# Patient Record
Sex: Female | Born: 1945 | Race: White | Hispanic: No | Marital: Married | Smoking: Former smoker
Health system: Southern US, Community
[De-identification: ages and names within clinical notes are randomized; demographics above are authoritative.]

## PROBLEM LIST (undated history)

## (undated) DIAGNOSIS — K219 Gastro-esophageal reflux disease without esophagitis: Secondary | ICD-10-CM

## (undated) DIAGNOSIS — T7840XA Allergy, unspecified, initial encounter: Secondary | ICD-10-CM

## (undated) DIAGNOSIS — E0789 Other specified disorders of thyroid: Secondary | ICD-10-CM

## (undated) DIAGNOSIS — M1990 Unspecified osteoarthritis, unspecified site: Secondary | ICD-10-CM

## (undated) DIAGNOSIS — E079 Disorder of thyroid, unspecified: Secondary | ICD-10-CM

## (undated) HISTORY — PX: WISDOM TOOTH EXTRACTION: SHX21

## (undated) HISTORY — DX: Other specified disorders of thyroid: E07.89

## (undated) HISTORY — DX: Allergy, unspecified, initial encounter: T78.40XA

## (undated) HISTORY — PX: BIOPSY THYROID: PRO38

## (undated) HISTORY — DX: Gastro-esophageal reflux disease without esophagitis: K21.9

## (undated) HISTORY — PX: OTHER SURGICAL HISTORY: SHX169

## (undated) HISTORY — DX: Unspecified osteoarthritis, unspecified site: M19.90

## (undated) HISTORY — DX: Disorder of thyroid, unspecified: E07.9

---

## 2014-10-22 ENCOUNTER — Emergency Department (HOSPITAL_COMMUNITY): Payer: Medicare Other

## 2014-10-22 ENCOUNTER — Encounter (HOSPITAL_COMMUNITY): Payer: Self-pay | Admitting: *Deleted

## 2014-10-22 ENCOUNTER — Emergency Department (HOSPITAL_COMMUNITY)
Admission: EM | Admit: 2014-10-22 | Discharge: 2014-10-22 | Disposition: A | Discharge status: Home / Self Care | Payer: Medicare Other | Attending: Emergency Medicine | Admitting: Emergency Medicine

## 2014-10-22 DIAGNOSIS — R079 Chest pain, unspecified: Secondary | ICD-10-CM | POA: Insufficient documentation

## 2014-10-22 DIAGNOSIS — Z87891 Personal history of nicotine dependence: Secondary | ICD-10-CM | POA: Diagnosis not present

## 2014-10-22 DIAGNOSIS — R0602 Shortness of breath: Secondary | ICD-10-CM | POA: Diagnosis not present

## 2014-10-22 LAB — BASIC METABOLIC PANEL
ANION GAP: 10 (ref 5–15)
BUN: 14 mg/dL (ref 6–20)
CO2: 21 mmol/L — ABNORMAL LOW (ref 22–32)
CREATININE: 0.77 mg/dL (ref 0.44–1.00)
Calcium: 9.3 mg/dL (ref 8.9–10.3)
Chloride: 107 mmol/L (ref 101–111)
Glucose, Bld: 102 mg/dL — ABNORMAL HIGH (ref 65–99)
POTASSIUM: 4.7 mmol/L (ref 3.5–5.1)
Sodium: 138 mmol/L (ref 135–145)

## 2014-10-22 LAB — CBC
HEMATOCRIT: 45.6 % (ref 36.0–46.0)
Hemoglobin: 15.7 g/dL — ABNORMAL HIGH (ref 12.0–15.0)
MCH: 30.4 pg (ref 26.0–34.0)
MCHC: 34.4 g/dL (ref 30.0–36.0)
MCV: 88.4 fL (ref 78.0–100.0)
Platelets: 234 10*3/uL (ref 150–400)
RBC: 5.16 MIL/uL — ABNORMAL HIGH (ref 3.87–5.11)
RDW: 13.5 % (ref 11.5–15.5)
WBC: 7.3 10*3/uL (ref 4.0–10.5)

## 2014-10-22 LAB — I-STAT TROPONIN, ED: Troponin i, poc: 0 ng/mL (ref 0.00–0.08)

## 2014-10-22 LAB — D-DIMER, QUANTITATIVE (NOT AT ARMC): D DIMER QUANT: 0.3 ug{FEU}/mL (ref 0.00–0.48)

## 2014-10-22 LAB — BRAIN NATRIURETIC PEPTIDE: B NATRIURETIC PEPTIDE 5: 15.2 pg/mL (ref 0.0–100.0)

## 2014-10-22 MED ORDER — ASPIRIN 81 MG PO CHEW
324.0000 mg | CHEWABLE_TABLET | Freq: Once | ORAL | Status: AC
  Administered 2014-10-22: 324 mg via ORAL
  Filled 2014-10-22: qty 4

## 2014-10-22 NOTE — ED Notes (Signed)
Pt is currently asymptomatic

## 2014-10-22 NOTE — ED Provider Notes (Signed)
CSN: [id number]     Arrival date & time 10/22/14  1125 History   First MD Initiated Contact with Patient 10/22/14 1129     Chief Complaint  Patient presents with  . Chest Pain  . Shortness of Breath     (Consider location/radiation/quality/duration/timing/severity/associated sxs/prior Treatment) Patient is a 69 y.o. female presenting with chest pain and shortness of breath. The history is provided by the patient and the spouse.  Chest Pain Associated symptoms: shortness of breath   Associated symptoms: no abdominal pain, no back pain, no fever, no headache, no nausea and not vomiting   Shortness of Breath Associated symptoms: chest pain   Associated symptoms: no abdominal pain, no fever, no headaches, no neck pain, no rash and no vomiting    patient presents with a complaint of shortness of breath and chest pain. Both have been intermittent. Chest pain lasting just a few minutes certainly less than 15 minutes. Main chest pain is the left lower anterior chest occasionally some substernal chest pain no nausea no vomiting. This is been ongoing for about a week. Shortness of breath has been ongoing since January.  Patient primary care doctor is in the [place] area.  History reviewed. No pertinent past medical history. Past Surgical History  Procedure Laterality Date  . Spinal cyst removed    . Breast surgery      biopsy   No family history on file. History  Substance Use Topics  . Smoking status: Former Research scientist (life sciences)  . Smokeless tobacco: Not on file  . Alcohol Use: No   OB History    No data available     Review of Systems  Constitutional: Negative for fever.  HENT: Negative for congestion.   Eyes: Negative for visual disturbance.  Respiratory: Positive for shortness of breath.   Cardiovascular: Positive for chest pain.  Gastrointestinal: Negative for nausea, vomiting and abdominal pain.  Genitourinary: Negative for dysuria.  Musculoskeletal: Negative for back pain and neck  pain.  Skin: Negative for rash.  Neurological: Negative for headaches.  Hematological: Does not bruise/bleed easily.  Psychiatric/Behavioral: Negative for confusion.      Allergies  Codeine  Home Medications   Prior to Admission medications   Not on File   BP 112/59 mmHg  Pulse 66  Temp(Src) 97.9 F (36.6 C) (Oral)  Resp 19  SpO2 97% Physical Exam  Constitutional: She is oriented to person, place, and time. She appears well-developed and well-nourished. No distress.  HENT:  Head: Normocephalic and atraumatic.  Mouth/Throat: Oropharynx is clear and moist.  Eyes: Conjunctivae and EOM are normal. Pupils are equal, round, and reactive to light.  Neck: Normal range of motion.  Cardiovascular: Normal rate, regular rhythm and normal heart sounds.   No murmur heard. Pulmonary/Chest: Effort normal and breath sounds normal. No respiratory distress.  Abdominal: Soft. Bowel sounds are normal. There is no tenderness.  Musculoskeletal: Normal range of motion. She exhibits no edema.  Neurological: She is alert and oriented to person, place, and time. No cranial nerve deficit. She exhibits normal muscle tone. Coordination normal.  Skin: Skin is warm. No rash noted.  Nursing note and vitals reviewed.   ED Course  Procedures (including critical care time) Labs Review Labs Reviewed  CBC - Abnormal; Notable for the following:    RBC 5.16 (*)    Hemoglobin 15.7 (*)    All other components within normal limits  BASIC METABOLIC PANEL - Abnormal; Notable for the following:    CO2 21 (*)  Glucose, Bld 102 (*)    All other components within normal limits  BRAIN NATRIURETIC PEPTIDE  D-DIMER, QUANTITATIVE (NOT AT [facility])  I-STAT TROPOININ, ED   Results for orders placed or performed during the hospital encounter of 10/22/14  CBC  Result Value Ref Range   WBC 7.3 4.0 - 10.5 K/uL   RBC 5.16 (H) 3.87 - 5.11 MIL/uL   Hemoglobin 15.7 (H) 12.0 - 15.0 g/dL   HCT 45.6 36.0 - 46.0 %   MCV  88.4 78.0 - 100.0 fL   MCH 30.4 26.0 - 34.0 pg   MCHC 34.4 30.0 - 36.0 g/dL   RDW 13.5 11.5 - 15.5 %   Platelets 234 150 - 400 K/uL  BNP (order ONLY if patient complains of dyspnea/SOB AND you have documented it for THIS visit)  Result Value Ref Range   B Natriuretic Peptide 15.2 0.0 - 100.0 pg/mL  Basic metabolic panel  Result Value Ref Range   Sodium 138 135 - 145 mmol/L   Potassium 4.7 3.5 - 5.1 mmol/L   Chloride 107 101 - 111 mmol/L   CO2 21 (L) 22 - 32 mmol/L   Glucose, Bld 102 (H) 65 - 99 mg/dL   BUN 14 6 - 20 mg/dL   Creatinine, Ser 0.77 0.44 - 1.00 mg/dL   Calcium 9.3 8.9 - 10.3 mg/dL   GFR calc non Af Amer >60 >60 mL/min   GFR calc Af Amer >60 >60 mL/min   Anion gap 10 5 - 15  D-dimer, quantitative (not at [facility])  Result Value Ref Range   D-Dimer, Quant 0.30 0.00 - 0.48 ug/mL-FEU  I-stat troponin, ED  (not at [facility])  Result Value Ref Range   Troponin i, poc 0.00 0.00 - 0.08 ng/mL   Comment 3             Imaging Review Dg Chest 2 View  10/22/2014   CLINICAL DATA:  Shortness of breath.  Left chest pain.  Ex-smoker.  EXAM: CHEST  2 VIEW  COMPARISON:  None.  FINDINGS: Midline trachea. Normal heart size. Calcified mass in the right paratracheal space measures 4.1 x 2.6 cm and is most consistent with a node secondary to remote granulomatous disease. No pleural effusion or pneumothorax. Clear lungs.  IMPRESSION: No acute cardiopulmonary disease.  Calcified nodal mass in the right paratracheal space is most likely related to old granulomatous disease.   Electronically Signed   By: Abigail Miyamoto M.D.   On: 10/22/2014 12:55   Ct Chest Wo Contrast  10/22/2014   CLINICAL DATA:  Left chest pain and shortness of breath  EXAM: CT CHEST WITHOUT CONTRAST  TECHNIQUE: Multidetector CT imaging of the chest was performed following the standard protocol without IV contrast.  COMPARISON:  Chest radiograph October 22, 2014  FINDINGS: There is mild atelectatic change in the periphery of right  middle lobe. There is no lung edema or consolidation.  There is a calcified lower right peritracheal lymph node measuring 2.7 x 2.4 cm. There is also a calcified sub- carinal lymph node. These findings indicate prior granulomatous disease. Currently there are a few small mediastinal lymph nodes but no adenopathy by size criteria.  There is an apparent mass in the right lobe of the thyroid measuring 2.0 x 1.5 cm.  There is atherosclerotic change in the aorta but no aneurysm. There are scattered foci of coronary artery calcification. The pericardium is not thickened.  Visualized upper abdominal structures appear unremarkable.  There are no blastic  or lytic bone lesions. No chest wall lesions are appreciable.  IMPRESSION: Evidence of prior granulomatous disease with calcified right paratracheal and sub- carinal lymph nodes. No adenopathy currently.  No edema or consolidation.  Mild atelectasis right middle lobe.  Mass in right lobe of thyroid. Advise further evaluation with thyroid ultrasound. If patient is clinically hyperthyroid, consider nuclear medicine thyroid uptake and scan. A dominant mass of this size may well warrant percutaneous biopsy; ultrasound to confirm that this nodular lesion is a single dominant mass is advised.  Scattered foci of coronary artery calcification.   Electronically Signed   By: Lowella Grip III M.D.   On: 10/22/2014 14:15     EKG Interpretation   Date/Time:  Thursday October 22 2014 11:30:44 EDT Ventricular Rate:  70 PR Interval:  142 QRS Duration: 86 QT Interval:  408 QTC Calculation: 440 R Axis:   6 Text Interpretation:  Normal sinus rhythm Low voltage QRS Cannot rule out  Anterior infarct , age undetermined Abnormal ECG Artifact Confirmed by  Dulce Martian  MD, Johnie Stadel [phone]) on 10/22/2014 11:37:51 AM      MDM   Final diagnoses:  Chest pain  SOB (shortness of breath)    Additional workup will be needed for the thyroid nodule. No evidence of an acute cardiac  event. No evidence of blood clot in the lungs. No evidence of pneumonia. Significant cause of patient's shortness of breath and intermittent chest discomfort not clear additional workup to primary care doctor will be required. Patient stable for discharge home.    Fredia Sorrow, MD 10/22/14 [phone]

## 2014-10-22 NOTE — ED Notes (Signed)
PT is here with shortness of breath even at rest and reports sometimes loses breath talking on phone.  Pt states pain under left breast rib area. No cardiac history

## 2014-10-22 NOTE — ED Notes (Signed)
Pt is in stable condition upon d/c and ambulates from ED. 

## 2014-10-22 NOTE — Discharge Instructions (Signed)
Follow-up with your doctor. About the chest pain on and off additional workup would be appropriate. No evidence of any acute findings today. Chest x-ray and chest CT negative. However there was evidence of a thyroid nodule that will need additional workup. Follow-up with your doctor for that. Return for any new or worse symptoms.

## 2014-11-19 ENCOUNTER — Encounter: Payer: Self-pay | Admitting: Endocrinology

## 2014-11-19 ENCOUNTER — Ambulatory Visit (INDEPENDENT_AMBULATORY_CARE_PROVIDER_SITE_OTHER): Payer: Medicare Other | Admitting: Endocrinology

## 2014-11-19 ENCOUNTER — Other Ambulatory Visit (HOSPITAL_COMMUNITY)
Admission: RE | Admit: 2014-11-19 | Discharge: 2014-11-19 | Disposition: A | Discharge status: Home / Self Care | Payer: Medicare Other | Source: Ambulatory Visit | Attending: Endocrinology | Admitting: Endocrinology

## 2014-11-19 VITALS — BP 130/82 | HR 72 | Temp 98.1°F | Resp 16 | Ht 65.0 in | Wt 233.0 lb

## 2014-11-19 DIAGNOSIS — E041 Nontoxic single thyroid nodule: Secondary | ICD-10-CM | POA: Insufficient documentation

## 2014-11-19 LAB — TSH: TSH: 0.53 u[IU]/mL (ref 0.35–4.50)

## 2014-11-19 NOTE — Patient Instructions (Addendum)
blood tests are requested for you today.  We'll let you know about the results, of this, and the biopsy, too. If no cancer is seen, please come back for a follow-up appointment in 6-12 months. most of the time, a "lumpy thyroid" will eventually become overactive.  this is usually a slow process, happening over the span of many years.

## 2014-11-19 NOTE — Progress Notes (Signed)
   Subjective:    Patient ID: Theresa Garrison, female    DOB: 1946/03/08, 69 y.o.   MRN: [id number]  HPI 1 month ago, pt was incidentally noted on a CT scan to have a nodule in the thyroid.  She has never had any thyroid problem in the past.  she has no h/o XRT or surgery to the neck.  She has slight sob sensation in the chest, but no assoc cough.   No past medical history on file.  Past Surgical History  Procedure Laterality Date  . Spinal cyst removed    . Breast surgery      biopsy    History   Social History  . Marital Status: Married    Spouse Name: N/A  . Number of Children: N/A  . Years of Education: N/A   Occupational History  . Not on file.   Social History Main Topics  . Smoking status: Former Research scientist (life sciences)  . Smokeless tobacco: Not on file  . Alcohol Use: No  . Drug Use: No  . Sexual Activity: Not on file   Other Topics Concern  . Not on file   Social History Narrative    No current outpatient prescriptions on file prior to visit.   No current facility-administered medications on file prior to visit.    Allergies  Allergen Reactions  . Codeine     Family History  Problem Relation Age of Onset  . Thyroid disease Neg Hx     BP 130/82 mmHg  Pulse 72  Temp(Src) 98.1 F (36.7 C) (Oral)  Resp 16  Ht 5\' 5"  (1.651 m)  Wt 233 lb (105.688 kg)  BMI 38.77 kg/m2  SpO2 91%  Review of Systems Denies neck pain and dysphagia.     Objective:   Physical Exam VITAL SIGNS:  See vs page GENERAL: no distress eyes: no periorbital swelling, no proptosis NECK: The right thyroid nodule is palpable.     Nodes: No palpable lymphadenopathy at the anterior neck. Skin: not diaphoretic.  Neuro: no tremor. PSYCH: Alert and well-oriented.  Does not appear anxious nor depressed.     Radiol (10/22/14): Thyroid US: apparent mass in the right lobe of the thyroid measuring 2.0 x 1.5 cm.  Lab Results  Component Value Date   TSH 0.53 11/19/2014    thyroid needle  bx: consent obtained, signed form on chart The area is first sprayed with cooling agent local: xylocaine 2%, with epinephrine prep: alcohol pad 4 bxs are done with 25 and 16R needles no complications    Assessment & Plan:  Thyroid nodule, new, uncertain etiology.  Patient is advised the following: Patient Instructions  blood tests are requested for you today.  We'll let you know about the results, of this, and the biopsy, too. If no cancer is seen, please come back for a follow-up appointment in 6-12 months. most of the time, a "lumpy thyroid" will eventually become overactive.  this is usually a slow process, happening over the span of many years.

## 2014-11-20 ENCOUNTER — Telehealth: Payer: Self-pay

## 2014-11-20 NOTE — Telephone Encounter (Signed)
Returned pt call for results.She understands them and also is waiting for her pathology report from her in office biopsy

## 2015-05-26 ENCOUNTER — Ambulatory Visit (INDEPENDENT_AMBULATORY_CARE_PROVIDER_SITE_OTHER): Payer: Medicare Other | Admitting: Endocrinology

## 2015-05-26 ENCOUNTER — Encounter: Payer: Self-pay | Admitting: Endocrinology

## 2015-05-26 VITALS — BP 128/80 | HR 90 | Temp 97.7°F | Ht 65.0 in | Wt 234.0 lb

## 2015-05-26 DIAGNOSIS — R21 Rash and other nonspecific skin eruption: Secondary | ICD-10-CM

## 2015-05-26 DIAGNOSIS — E041 Nontoxic single thyroid nodule: Secondary | ICD-10-CM | POA: Diagnosis not present

## 2015-05-26 LAB — TSH: TSH: 0.47 u[IU]/mL (ref 0.35–4.50)

## 2015-05-26 MED ORDER — TRIAMCINOLONE ACETONIDE 0.1 % EX CREA: 1.0000 "application " | TOPICAL_CREAM | Freq: Three times a day (TID) | CUTANEOUS | Status: DC

## 2015-05-26 NOTE — Progress Notes (Signed)
   Subjective:    Patient ID: Theresa Garrison, female    DOB: [birth year], 70 y.o.   MRN: [id number]  HPI Pt returns for f/u of thyroid nodule (dx'ed 2016, incidentally noted on a CT; bx then showed SCANT BENIGN FOLLICULAR EPITHELIUM). She does not notice the goiter.  Pt reports 2 mos of moderate rash, worst on the forearms, and assoc itching.   No past medical history on file.  Past Surgical History  Procedure Laterality Date  . Spinal cyst removed    . Breast surgery      biopsy    Social History   Social History  . Marital Status: Married    Spouse Name: N/A  . Number of Children: N/A  . Years of Education: N/A   Occupational History  . Not on file.   Social History Main Topics  . Smoking status: Former Research scientist (life sciences)  . Smokeless tobacco: Not on file  . Alcohol Use: No  . Drug Use: No  . Sexual Activity: Not on file   Other Topics Concern  . Not on file   Social History Narrative    No current outpatient prescriptions on file prior to visit.   No current facility-administered medications on file prior to visit.    Allergies  Allergen Reactions  . Codeine     Family History  Problem Relation Age of Onset  . Thyroid disease Neg Hx     BP 128/80 mmHg  Pulse 90  Temp(Src) 97.7 F (36.5 C) (Oral)  Ht 5\' 5"  (1.651 m)  Wt 234 lb (106.142 kg)  BMI 38.94 kg/m2  SpO2 94%  Review of Systems Denies fever and palpitations.     Objective:   Physical Exam VITAL SIGNS:  See vs page GENERAL: no distress Neck: there is approx 3 cm right thyroid nodule.  The left lobe seems enlarged also, but I cannot palpate and discrete nodule there.  Skin: moderate eczematous rash on the forearms.    Lab Results  Component Value Date   TSH 0.47 05/26/2015      Assessment & Plan:  Thyroid nodule, due for recheck.   Rash, new to me, uncertain etiology.     Patient is advised the following: Patient Instructions  i have sent a prescription to your pharmacy, for the rash.    Please ask Dr Margo Aye about this when you next see him, as i don't know the cause. A thyroid blood test is requested for you today.  We'll let you know about the results.  Let's check the ultrasound.  you will receive a phone call, about a day and time for an appointment.  most of the time, a "lumpy thyroid" will eventually become overactive.  this is usually a slow process, happening over the span of many years.  Please come back for a follow-up appointment in 6-12 months.

## 2015-05-26 NOTE — Patient Instructions (Addendum)
i have sent a prescription to your pharmacy, for the rash.  Please ask Dr Margo Aye about this when you next see him, as i don't know the cause. A thyroid blood test is requested for you today.  We'll let you know about the results.  Let's check the ultrasound.  you will receive a phone call, about a day and time for an appointment.  most of the time, a "lumpy thyroid" will eventually become overactive.  this is usually a slow process, happening over the span of many years.  Please come back for a follow-up appointment in 6-12 months.

## 2015-06-14 ENCOUNTER — Encounter: Payer: Self-pay | Admitting: Internal Medicine

## 2015-06-14 ENCOUNTER — Telehealth: Payer: Self-pay

## 2015-06-14 ENCOUNTER — Telehealth: Payer: Self-pay | Admitting: Endocrinology

## 2015-06-14 DIAGNOSIS — E041 Nontoxic single thyroid nodule: Secondary | ICD-10-CM

## 2015-06-14 NOTE — Telephone Encounter (Signed)
[facility] called and advised the pt did not want to schedule her head neck and shoulder ultra sound at this time because she had questions. I called the pt and left a voicemail requesting a call back to discuss scheduling her [place].

## 2015-06-14 NOTE — Telephone Encounter (Signed)
I contacted the pt. She stated she was advised during her last office visit if her thyroid blood test came back normal then she would not need the US done. Pt wanted to know if this is still true. Pt also wanted to know if she needed the [place] if should could wait unitl 08/07/2015.

## 2015-06-14 NOTE — Telephone Encounter (Signed)
Pt returning Megans call °

## 2015-06-15 NOTE — Telephone Encounter (Signed)
Ok, i have ordered

## 2015-06-15 NOTE — Addendum Note (Signed)
Addended by: Renato Shin on: 06/15/2015 09:36 AM   Modules accepted: Orders

## 2015-06-15 NOTE — Telephone Encounter (Signed)
I contacted the pt and advised of note below. [facility] has cancelled the order out and requested another one to be placed in Aprl.

## 2015-06-15 NOTE — Telephone Encounter (Signed)
Sorry i was not clear. The blood test was normal--good. We also want to make sure the nodule has not changed in size. It is good to recheck the US< but we can wait until later this year if you want.

## 2015-07-21 ENCOUNTER — Ambulatory Visit (AMBULATORY_SURGERY_CENTER): Payer: Self-pay | Admitting: *Deleted

## 2015-07-21 VITALS — Ht 65.5 in | Wt 234.0 lb

## 2015-07-21 DIAGNOSIS — Z1211 Encounter for screening for malignant neoplasm of colon: Secondary | ICD-10-CM

## 2015-07-21 MED ORDER — NA SULFATE-K SULFATE-MG SULF 17.5-3.13-1.6 GM/177ML PO SOLN: 1.0000 | Freq: Once | ORAL | Status: DC

## 2015-07-21 NOTE — Progress Notes (Signed)
No egg or soy allergy known to patient  No issues with past sedation with any surgeries  or procedures, no intubation problems  No diet pills per patient No home 02 use per patient  No blood thinners per patient  Pt denies issues with constipation except occasional constipation, nothing chronic Declined emmi video

## 2015-08-06 ENCOUNTER — Encounter: Payer: Self-pay | Admitting: Internal Medicine

## 2015-08-06 ENCOUNTER — Ambulatory Visit (AMBULATORY_SURGERY_CENTER): Payer: Medicare Other | Admitting: Internal Medicine

## 2015-08-06 VITALS — BP 121/64 | HR 66 | Temp 99.3°F | Resp 12 | Ht 65.5 in | Wt 234.0 lb

## 2015-08-06 DIAGNOSIS — D123 Benign neoplasm of transverse colon: Secondary | ICD-10-CM | POA: Diagnosis not present

## 2015-08-06 DIAGNOSIS — D120 Benign neoplasm of cecum: Secondary | ICD-10-CM

## 2015-08-06 DIAGNOSIS — Z1211 Encounter for screening for malignant neoplasm of colon: Secondary | ICD-10-CM | POA: Diagnosis present

## 2015-08-06 DIAGNOSIS — D124 Benign neoplasm of descending colon: Secondary | ICD-10-CM

## 2015-08-06 DIAGNOSIS — D122 Benign neoplasm of ascending colon: Secondary | ICD-10-CM

## 2015-08-06 MED ORDER — SODIUM CHLORIDE 0.9 % IV SOLN: 500.0000 mL | INTRAVENOUS | Status: DC

## 2015-08-06 NOTE — Progress Notes (Signed)
Called to room to assist during endoscopic procedure.  Patient ID and intended procedure confirmed with present staff. Received instructions for my participation in the procedure from the performing physician.  

## 2015-08-06 NOTE — Progress Notes (Signed)
A/ox3 pleased with MAC, report to Sheila RN 

## 2015-08-06 NOTE — Patient Instructions (Signed)
YOU HAD AN ENDOSCOPIC PROCEDURE TODAY AT [facility]:   Refer to the procedure report that was given to you for any specific questions about what was found during the examination.  If the procedure report does not answer your questions, please call your gastroenterologist to clarify.  If you requested that your care partner not be given the details of your procedure findings, then the procedure report has been included in a sealed envelope for you to review at your convenience later.  YOU SHOULD EXPECT: Some feelings of bloating in the abdomen. Passage of more gas than usual.  Walking can help get rid of the air that was put into your GI tract during the procedure and reduce the bloating. If you had a lower endoscopy (such as a colonoscopy or flexible sigmoidoscopy) you may notice spotting of blood in your stool or on the toilet paper. If you underwent a bowel prep for your procedure, you may not have a normal bowel movement for a few days.  Please Note:  You might notice some irritation and congestion in your nose or some drainage.  This is from the oxygen used during your procedure.  There is no need for concern and it should clear up in a day or so.  SYMPTOMS TO REPORT IMMEDIATELY:   Following lower endoscopy (colonoscopy or flexible sigmoidoscopy):  Excessive amounts of blood in the stool  Significant tenderness or worsening of abdominal pains  Swelling of the abdomen that is new, acute  Fever of 100F or higher    For urgent or emergent issues, a gastroenterologist can be reached at any hour by calling [phone].   DIET: Your first meal following the procedure should be a small meal and then it is ok to progress to your normal diet. Heavy or fried foods are harder to digest and may make you feel nauseous or bloated.  Likewise, meals heavy in dairy and vegetables can increase bloating.  Drink plenty of fluids but you should avoid alcoholic beverages for 24  hours.  ACTIVITY:  You should plan to take it easy for the rest of today and you should NOT DRIVE or use heavy machinery until tomorrow (because of the sedation medicines used during the test).    FOLLOW UP: Our staff will call the number listed on your records the next business day following your procedure to check on you and address any questions or concerns that you may have regarding the information given to you following your procedure. If we do not reach you, we will leave a message.  However, if you are feeling well and you are not experiencing any problems, there is no need to return our call.  We will assume that you have returned to your regular daily activities without incident.  If any biopsies were taken you will be contacted by phone or by letter within the next 1-3 weeks.  Please call us at [phone] if you have not heard about the biopsies in 3 weeks.    SIGNATURES/CONFIDENTIALITY: You and/or your care partner have signed paperwork which will be entered into your electronic medical record.  These signatures attest to the fact that that the information above on your After Visit Summary has been reviewed and is understood.  Full responsibility of the confidentiality of this discharge information lies with you and/or your care-partner.   No ibuprofen,naproxen or Nsaids for 2 weeks, but resume remainder of medications. Information given on polyps, diverticulosis,hemorrhoids and high fiber diet.

## 2015-08-06 NOTE — Op Note (Signed)
[name] Patient Name: Theresa Garrison Procedure Date: 08/06/2015 9:06 AM MRN: [id number] Endoscopist: Jerene Bears , MD Age: 70 Referring MD:  Date of Birth: 03/17/1946 Gender: Female Procedure:                Colonoscopy Indications:              Screening for colorectal malignant neoplasm, This                            is the patient's first colonoscopy Medicines:                Monitored Anesthesia Care Procedure:                Pre-Anesthesia Assessment:                           - Prior to the procedure, a History and Physical                            was performed, and patient medications and                            allergies were reviewed. The patient's tolerance of                            previous anesthesia was also reviewed. The risks                            and benefits of the procedure and the sedation                            options and risks were discussed with the patient.                            All questions were answered, and informed consent                            was obtained. Prior Anticoagulants: The patient has                            taken no previous anticoagulant or antiplatelet                            agents. ASA Grade Assessment: II - A patient with                            mild systemic disease. After reviewing the risks                            and benefits, the patient was deemed in                            satisfactory condition to undergo the procedure.  After obtaining informed consent, the colonoscope                            was passed under direct vision. Throughout the                            procedure, the patient's blood pressure, pulse, and                            oxygen saturations were monitored continuously. The                            Model PCF-H190L [id number]) scope was introduced                            through the anus and advanced to the the cecum,                             identified by appendiceal orifice and ileocecal                            valve. The colonoscopy was performed without                            difficulty. The patient tolerated the procedure                            well. The quality of the bowel preparation was                            good. The ileocecal valve, appendiceal orifice, and                            rectum were photographed. Scope In: 9:22:55 AM Scope Out: 9:58:53 AM Scope Withdrawal Time: 0 hours 32 minutes 56 seconds  Total Procedure Duration: 0 hours 35 minutes 58 seconds  Findings:      The digital rectal exam was normal.      Four sessile polyps were found in the cecum. The polyps were 4 to 7 mm       in size. These polyps were removed with a cold snare. Resection and       retrieval were complete.      Two sessile polyps were found in the ascending colon. The polyps were 10       to 14 mm in size. These polyps were removed with a hot snare. Resection       and retrieval were complete.      Two sessile polyps were found in the ascending colon. The polyps were 5       to 7 mm in size. These polyps were removed with a cold snare. Resection       and retrieval were complete.      A 2 mm polyp was found in the ascending colon. The polyp was sessile.       The polyp was removed with a cold biopsy forceps. Resection and  retrieval were complete.      A 10 mm polyp was found in the hepatic flexure. The polyp was sessile.       The polyp was removed with a hot snare. Resection and retrieval were       complete.      Three sessile polyps were found in the hepatic flexure. The polyps were       3 to 5 mm in size. These polyps were removed with a cold snare.       Resection and retrieval were complete.      Two sessile polyps were found in the transverse colon. The polyps were 8       to 15 mm in size. These polyps were removed with a hot snare. Resection       and retrieval were  complete.      Five sessile polyps were found in the transverse colon. The polyps were       4 to 6 mm in size. These polyps were removed with a cold snare.       Resection and retrieval were complete.      A 8 mm polyp was found in the descending colon. The polyp was sessile.       The polyp was removed with a hot snare. Resection and retrieval were       complete.      Multiple small and large-mouthed diverticula were found in the sigmoid       colon.      Internal hemorrhoids were found during retroflexion. The hemorrhoids       were small. Complications:            No immediate complications. Estimated Blood Loss:     Estimated blood loss was minimal. Impression:               - Four 4 to 7 mm polyps in the cecum, removed with                            a cold snare. Resected and retrieved.                           - Two 10 to 14 mm polyps in the ascending colon,                            removed with a hot snare. Resected and retrieved.                           - Two 5 to 7 mm polyps in the ascending colon,                            removed with a cold snare. Resected and retrieved.                           - One 2 mm polyp in the ascending colon, removed                            with a cold biopsy forceps. Resected and retrieved.                           -  One 10 mm polyp at the hepatic flexure, removed                            with a hot snare. Resected and retrieved.                           - Three 3 to 5 mm polyps at the hepatic flexure,                            removed with a cold snare. Resected and retrieved.                           - Two 8 to 15 mm polyps in the transverse colon,                            removed with a hot snare. Resected and retrieved.                           - Five 4 to 6 mm polyps in the transverse colon,                            removed with a cold snare. Resected and retrieved.                           - One 8 mm polyp in the  descending colon, removed                            with a hot snare. Resected and retrieved.                           - Moderate diverticulosis in the sigmoid colon.                           - Internal hemorrhoids. Recommendation:           - Patient has a contact number available for                            emergencies. The signs and symptoms of potential                            delayed complications were discussed with the                            patient. Return to normal activities tomorrow.                            Written discharge instructions were provided to the                            patient.                           -  Resume previous diet.                           - Continue present medications.                           - Await pathology results.                           - Repeat colonoscopy is recommended for                            surveillance at abbreviated interval given number                            of polyps removed today. The colonoscopy date will                            be determined after pathology results from today's                            exam become available for review.                           - No ibuprofen, naproxen, or other non-steroidal                            anti-inflammatory drugs for 2 weeks after polyp                            removal. Procedure Code(s):        --- Professional ---                           [phone], Colonoscopy, flexible; with removal of                            tumor(s), polyp(s), or other lesion(s) by snare                            technique                           45380, 59, Colonoscopy, flexible; with biopsy,                            single or multiple CPT copyright 2016 [employer]. All rights reserved. Jerene Bears, MD 08/06/2015 10:08:23 AM This report has been signed electronically. Number of Addenda: 0 Referring MD:      Jenetta Downer [place]

## 2015-08-09 ENCOUNTER — Telehealth: Payer: Self-pay | Admitting: *Deleted

## 2015-08-09 NOTE — Telephone Encounter (Signed)
  Follow up Call-  Call back number 08/06/2015  Post procedure Call Back phone  # [phone]  Permission to leave phone message Yes     Patient questions:  Do you have a fever, pain , or abdominal swelling? No. Pain Score  0 *  Have you tolerated food without any problems? Yes.    Have you been able to return to your normal activities? Yes.    Do you have any questions about your discharge instructions: Diet   No. Medications  No. Follow up visit  No.  Do you have questions or concerns about your Care? No.  Actions: * If pain score is 4 or above: No action needed, pain <4.

## 2015-08-10 ENCOUNTER — Encounter: Payer: Self-pay | Admitting: Internal Medicine

## 2015-08-17 ENCOUNTER — Ambulatory Visit
Admission: RE | Admit: 2015-08-17 | Discharge: 2015-08-17 | Disposition: A | Discharge status: Home / Self Care | Payer: Medicare Other | Source: Ambulatory Visit | Attending: Endocrinology | Admitting: Endocrinology

## 2015-08-17 DIAGNOSIS — E041 Nontoxic single thyroid nodule: Secondary | ICD-10-CM

## 2015-10-31 DIAGNOSIS — E042 Nontoxic multinodular goiter: Secondary | ICD-10-CM | POA: Insufficient documentation

## 2015-12-10 ENCOUNTER — Encounter: Payer: Self-pay | Admitting: Internal Medicine

## 2015-12-20 ENCOUNTER — Telehealth: Payer: Self-pay | Admitting: Internal Medicine

## 2015-12-20 NOTE — Telephone Encounter (Signed)
Okay for her to wait until after Christmas Will need 1 hour slot for next procedure Please place recall for January 2018 for hx of multiple colon polyps

## 2015-12-20 NOTE — Telephone Encounter (Signed)
Dr Hilarie Fredrickson the pt would like to wait until after christmas to repeat colon.  Per your recall recommendation it states 6-12 months.  Is it ok for her to wait?

## 2015-12-20 NOTE — Telephone Encounter (Signed)
Pt aware and recall has been changed to 05/08/2016.

## 2016-05-04 ENCOUNTER — Other Ambulatory Visit: Payer: Self-pay | Admitting: Endocrinology

## 2016-05-29 ENCOUNTER — Encounter: Payer: Self-pay | Admitting: Internal Medicine

## 2016-06-10 IMAGING — CT CT CHEST W/O CM
2 of 4 series · 15 of 36 positions shown, 18 images · non-contrast
Comparison: Chest radiograph October 22, 2014

CLINICAL DATA: Left chest pain and shortness of breath

EXAM:
CT CHEST WITHOUT CONTRAST
TECHNIQUE: Multidetector CT imaging of the chest was performed following the
standard protocol without IV contrast.

[Series 2: thorax 5.0 i31f 1 · axial · 0.66mm/px · z∈[-138,+97]mm · 12 of 53 slices shown, 15 images]
[im 3/53  mediastinal]
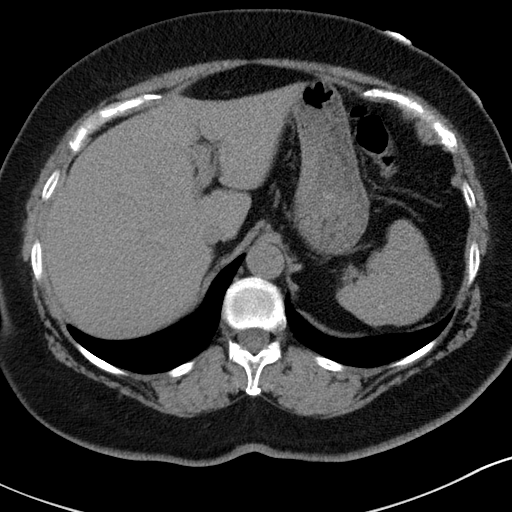
[im 3/53  lung]
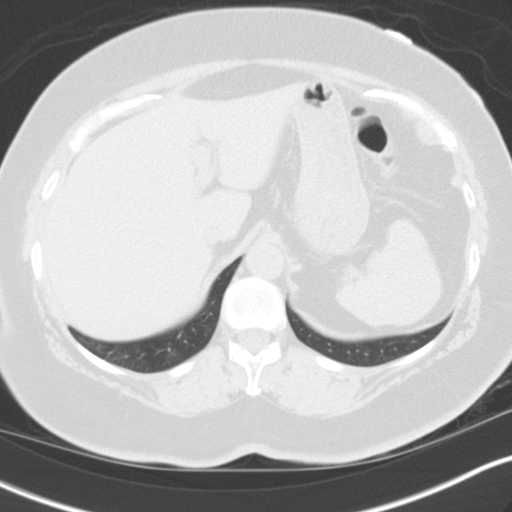
[im 7/53  lung]
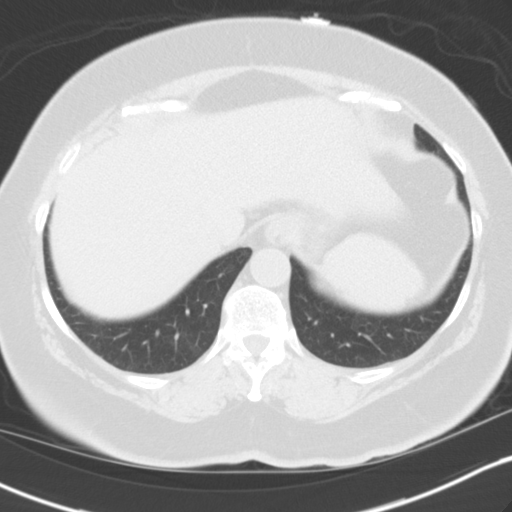
[im 11/53  lung]
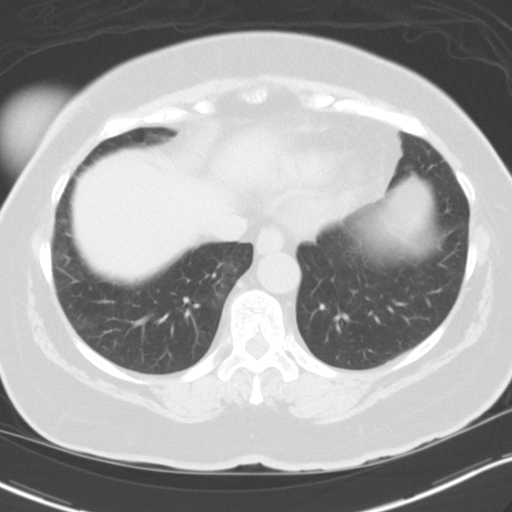
[im 16/53  lung]
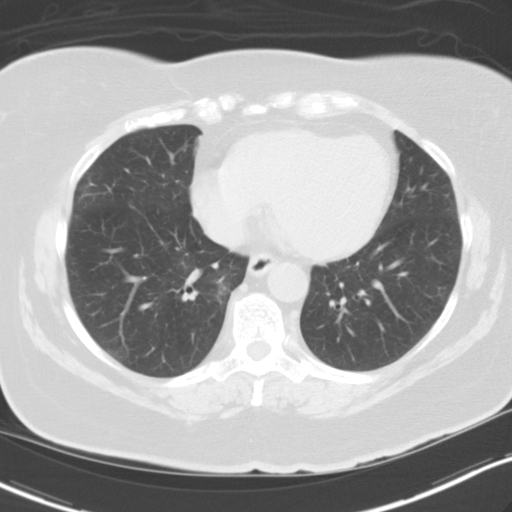
[im 20/53  mediastinal]
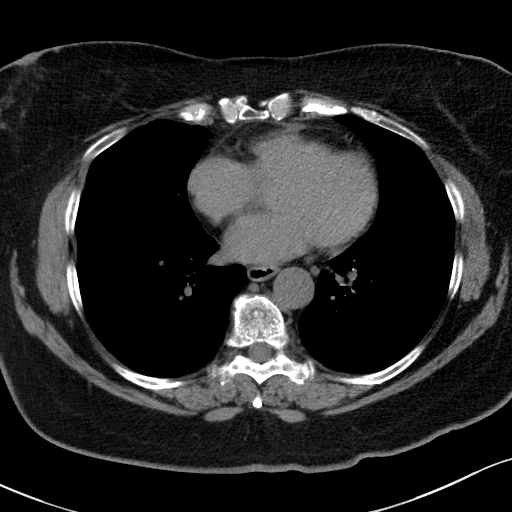
[im 20/53  lung]
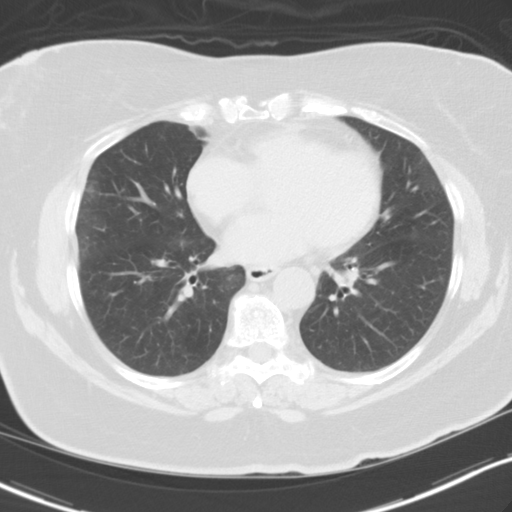
[im 24/53  lung]
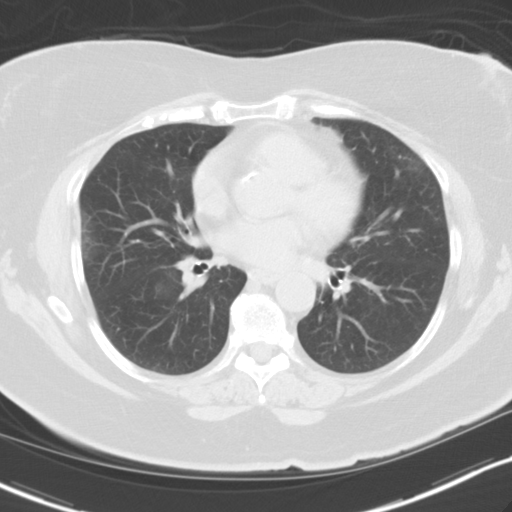
[im 29/53  lung]
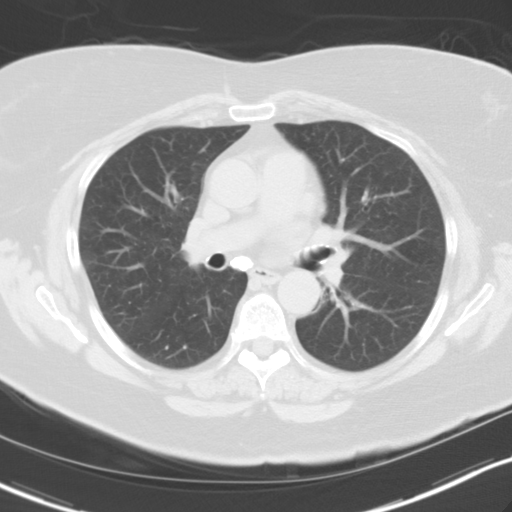
[im 33/53  lung]
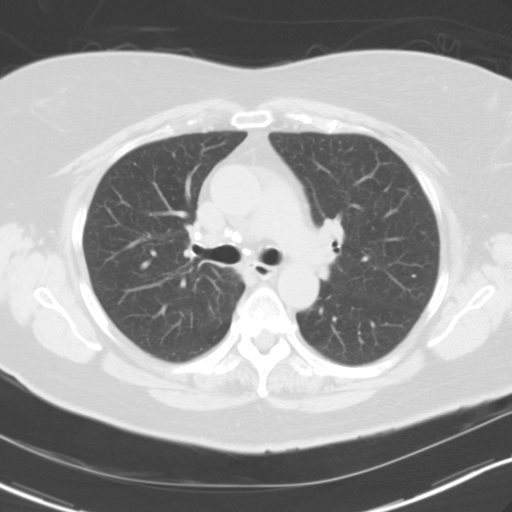
[im 37/53  mediastinal]
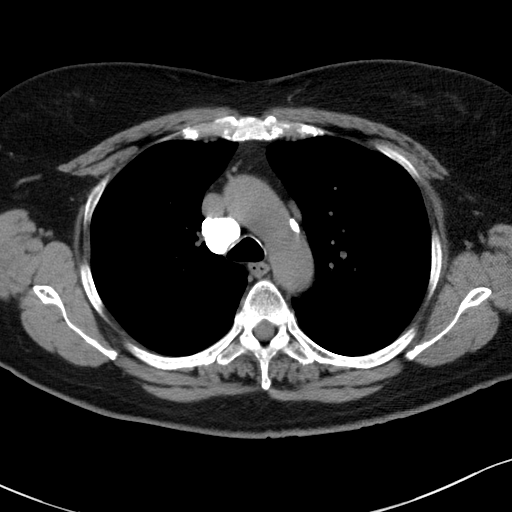
[im 37/53  lung]
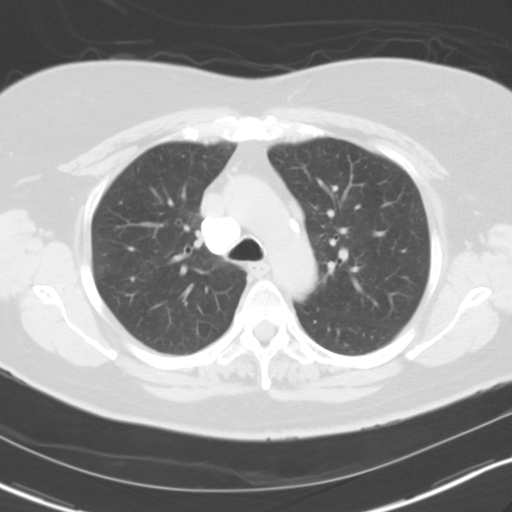
[im 42/53  lung]
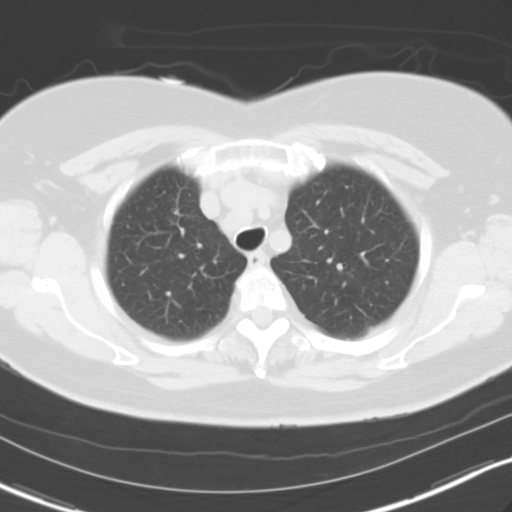
[im 46/53  lung]
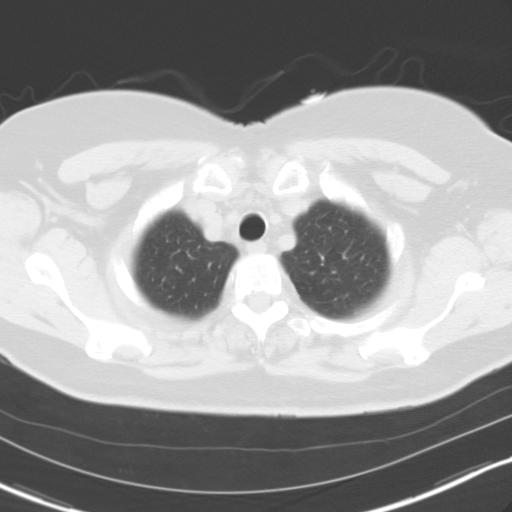
[im 50/53  lung]
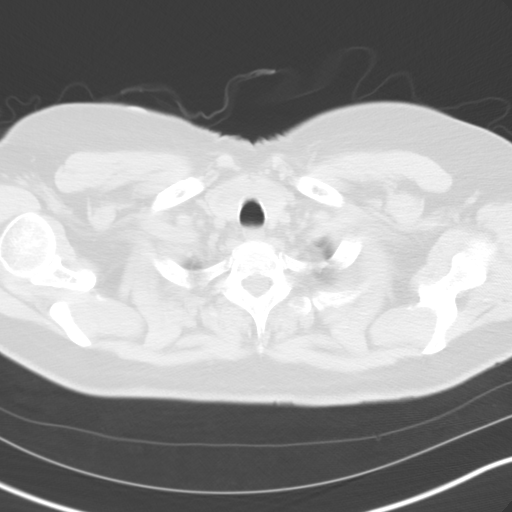

[Series 602: cor · coronal · 0.66mm/px · 3 of 94 slices shown]
[im 19/94  lung]
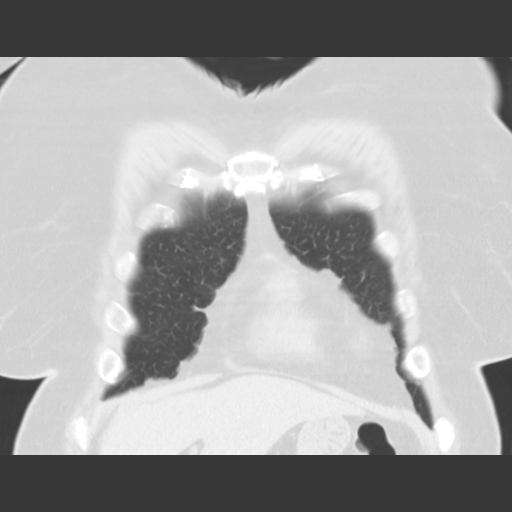
[im 38/94  lung]
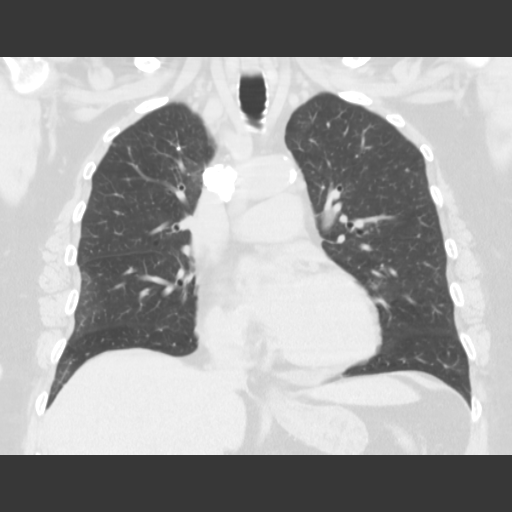
[im 56/94  lung]
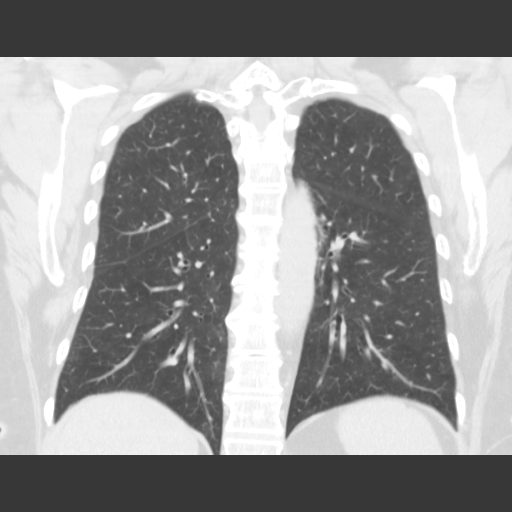

[15 of 36 positions shown; findings below may reference images not displayed]

FINDINGS: There is mild atelectatic change in the periphery of right middle
lobe. There is no lung edema or consolidation.

There is a calcified lower right peritracheal lymph node measuring
2.7 x 2.4 cm. There is also a calcified sub- carinal lymph node.
These findings indicate prior granulomatous disease. Currently there
are a few small mediastinal lymph nodes but no adenopathy by size
criteria.

There is an apparent mass in the right lobe of the thyroid measuring
2.0 x 1.5 cm.

There is atherosclerotic change in the aorta but no aneurysm. There
are scattered foci of coronary artery calcification. The pericardium
is not thickened.

Visualized upper abdominal structures appear unremarkable.

There are no blastic or lytic bone lesions. No chest wall lesions
are appreciable.
IMPRESSION: Evidence of prior granulomatous disease with calcified right
paratracheal and sub- carinal lymph nodes. No adenopathy currently.

No edema or consolidation.  Mild atelectasis right middle lobe.

Mass in right lobe of thyroid. Advise further evaluation with
thyroid ultrasound. If patient is clinically hyperthyroid, consider
nuclear medicine thyroid uptake and scan. A dominant mass of this
size may well warrant percutaneous biopsy; ultrasound to confirm
that this nodular lesion is a single dominant mass is advised.

Scattered foci of coronary artery calcification.

## 2016-06-23 DIAGNOSIS — R310 Gross hematuria: Secondary | ICD-10-CM | POA: Insufficient documentation

## 2016-07-11 ENCOUNTER — Ambulatory Visit (AMBULATORY_SURGERY_CENTER): Payer: Self-pay | Admitting: *Deleted

## 2016-07-11 VITALS — Ht 65.0 in | Wt 234.4 lb

## 2016-07-11 DIAGNOSIS — Z86010 Personal history of colonic polyps: Secondary | ICD-10-CM

## 2016-07-11 MED ORDER — NA SULFATE-K SULFATE-MG SULF 17.5-3.13-1.6 GM/177ML PO SOLN: ORAL | 0 refills | Status: DC

## 2016-07-11 NOTE — Progress Notes (Signed)
Patient ID: Theresa Garrison, female   DOB: 1946-04-16, 71 y.o.   MRN: [id number]   Pt denies allergies to eggs or soy products. Denies difficulty with sedation or anesthesia. Denies any diet or weight loss medications. Denies use of supplemental oxygen.  Emmi instructions refused by patient.

## 2016-07-25 ENCOUNTER — Encounter: Payer: Self-pay | Admitting: Internal Medicine

## 2016-07-25 ENCOUNTER — Ambulatory Visit (AMBULATORY_SURGERY_CENTER): Payer: Medicare Other | Admitting: Internal Medicine

## 2016-07-25 VITALS — BP 136/58 | HR 61 | Temp 97.8°F | Resp 11 | Ht 65.0 in | Wt 234.0 lb

## 2016-07-25 DIAGNOSIS — D128 Benign neoplasm of rectum: Secondary | ICD-10-CM

## 2016-07-25 DIAGNOSIS — K621 Rectal polyp: Secondary | ICD-10-CM

## 2016-07-25 DIAGNOSIS — K635 Polyp of colon: Secondary | ICD-10-CM

## 2016-07-25 DIAGNOSIS — D123 Benign neoplasm of transverse colon: Secondary | ICD-10-CM

## 2016-07-25 DIAGNOSIS — D124 Benign neoplasm of descending colon: Secondary | ICD-10-CM

## 2016-07-25 DIAGNOSIS — D129 Benign neoplasm of anus and anal canal: Secondary | ICD-10-CM

## 2016-07-25 DIAGNOSIS — D126 Benign neoplasm of colon, unspecified: Secondary | ICD-10-CM | POA: Diagnosis not present

## 2016-07-25 DIAGNOSIS — Z86010 Personal history of colonic polyps: Secondary | ICD-10-CM | POA: Diagnosis not present

## 2016-07-25 MED ORDER — SODIUM CHLORIDE 0.9 % IV SOLN: 500.0000 mL | INTRAVENOUS | Status: AC

## 2016-07-25 NOTE — Progress Notes (Signed)
Called to room to assist during endoscopic procedure.  Patient ID and intended procedure confirmed with present staff. Received instructions for my participation in the procedure from the performing physician.  

## 2016-07-25 NOTE — Op Note (Signed)
[facility] Patient Name: Theresa Garrison Procedure Date: 07/25/2016 8:13 AM MRN: [id number] Endoscopist: Jerene Bears , MD Age: 71 Referring MD:  Date of Birth: [birth year] Gender: Female Account #: [id number] Procedure:                Colonoscopy Indications:              Surveillance: History of numerous (> 10) adenomas                            on last colonoscopy (< 3 yrs) Medicines:                Monitored Anesthesia Care Procedure:                Pre-Anesthesia Assessment:                           - Prior to the procedure, a History and Physical                            was performed, and patient medications and                            allergies were reviewed. The patient's tolerance of                            previous anesthesia was also reviewed. The risks                            and benefits of the procedure and the sedation                            options and risks were discussed with the patient.                            All questions were answered, and informed consent                            was obtained. Prior Anticoagulants: The patient has                            taken no previous anticoagulant or antiplatelet                            agents. ASA Grade Assessment: II - A patient with                            mild systemic disease. After reviewing the risks                            and benefits, the patient was deemed in                            satisfactory condition to undergo the procedure.  After obtaining informed consent, the colonoscope                            was passed under direct vision. Throughout the                            procedure, the patient's blood pressure, pulse, and                            oxygen saturations were monitored continuously. The                            Model PCF-H190DL [id number] [id number]) scope was introduced                            through the anus and advanced to  the the cecum,                            identified by appendiceal orifice and ileocecal                            valve. The colonoscopy was performed without                            difficulty. The patient tolerated the procedure                            well. The quality of the bowel preparation was                            good. The ileocecal valve, appendiceal orifice, and                            rectum were photographed. Scope In: 8:54:34 AM Scope Out: 9:12:59 AM Scope Withdrawal Time: 0 hours 15 minutes 59 seconds  Total Procedure Duration: 0 hours 18 minutes 25 seconds  Findings:                 The digital rectal exam was normal.                           Two sessile polyps were found in the transverse                            colon. The polyps were 2 to 3 mm in size. These                            polyps were removed with a cold snare. Resection                            and retrieval were complete.                           A 4 mm polyp was found in the descending colon.  The                            polyp was sessile. The polyp was removed with a                            cold snare. Resection and retrieval were complete.                           Two sessile polyps were found in the rectum. The                            polyps were 3 to 4 mm in size. These polyps were                            removed with a cold snare. Resection and retrieval                            were complete.                           Multiple small and large-mouthed diverticula were                            found in the sigmoid colon and descending colon.                           Internal hemorrhoids were found during                            retroflexion. The hemorrhoids were small. Complications:            No immediate complications. Estimated Blood Loss:     Estimated blood loss was minimal. Impression:               - Two 2 to 3 mm polyps in the transverse colon,                             removed with a cold snare. Resected and retrieved.                           - One 4 mm polyp in the descending colon, removed                            with a cold snare. Resected and retrieved.                           - Two 3 to 4 mm polyps in the rectum, removed with                            a cold snare. Resected and retrieved.                           - Diverticulosis in the sigmoid colon  and in the                            descending colon.                           - Internal hemorrhoids. Recommendation:           - Patient has a contact number available for                            emergencies. The signs and symptoms of potential                            delayed complications were discussed with the                            patient. Return to normal activities tomorrow.                            Written discharge instructions were provided to the                            patient.                           - Resume previous diet.                           - Continue present medications.                           - Await pathology results.                           - Repeat colonoscopy is recommended for                            surveillance. The colonoscopy date will be                            determined after pathology results from today's                            exam become available for review. Jerene Bears, MD 07/25/2016 9:17:13 AM This report has been signed electronically.

## 2016-07-25 NOTE — Patient Instructions (Signed)
YOU HAD AN ENDOSCOPIC PROCEDURE TODAY AT [facility]:   Refer to the procedure report that was given to you for any specific questions about what was found during the examination.  If the procedure report does not answer your questions, please call your gastroenterologist to clarify.  If you requested that your care partner not be given the details of your procedure findings, then the procedure report has been included in a sealed envelope for you to review at your convenience later.  YOU SHOULD EXPECT: Some feelings of bloating in the abdomen. Passage of more gas than usual.  Walking can help get rid of the air that was put into your GI tract during the procedure and reduce the bloating. If you had a lower endoscopy (such as a colonoscopy or flexible sigmoidoscopy) you may notice spotting of blood in your stool or on the toilet paper. If you underwent a bowel prep for your procedure, you may not have a normal bowel movement for a few days.  Please Note:  You might notice some irritation and congestion in your nose or some drainage.  This is from the oxygen used during your procedure.  There is no need for concern and it should clear up in a day or so.  SYMPTOMS TO REPORT IMMEDIATELY:   Following lower endoscopy (colonoscopy or flexible sigmoidoscopy):  Excessive amounts of blood in the stool  Significant tenderness or worsening of abdominal pains  Swelling of the abdomen that is new, acute  Fever of 100F or higher  For urgent or emergent issues, a gastroenterologist can be reached at any hour by calling [phone].   DIET:  We do recommend a small meal at first, but then you may proceed to your regular diet.  Drink plenty of fluids but you should avoid alcoholic beverages for 24 hours.  MEDICATIONS:  Continue present medications.  ACTIVITY:  You should plan to take it easy for the rest of today and you should NOT DRIVE or use heavy machinery until tomorrow (because of the  sedation medicines used during the test).    FOLLOW UP: Our staff will call the number listed on your records the next business day following your procedure to check on you and address any questions or concerns that you may have regarding the information given to you following your procedure. If we do not reach you, we will leave a message.  However, if you are feeling well and you are not experiencing any problems, there is no need to return our call.  We will assume that you have returned to your regular daily activities without incident.  If any biopsies were taken you will be contacted by phone or by letter within the next 1-3 weeks.  Please call us at [phone] if you have not heard about the biopsies in 3 weeks.   Please see handouts given to you by your recovery nurse.  Thank you for allowing [place] to provide for our healthcare needs today.   SIGNATURES/CONFIDENTIALITY: You and/or your care partner have signed paperwork which will be entered into your electronic medical record.  These signatures attest to the fact that that the information above on your After Visit Summary has been reviewed and is understood.  Full responsibility of the confidentiality of this discharge information lies with you and/or your care-partner.

## 2016-07-25 NOTE — Progress Notes (Signed)
Spontaneous respirations throughout. VSS. Resting comfortably. To PACU on room air. Report to  Sara RN. 

## 2016-07-26 ENCOUNTER — Telehealth: Payer: Self-pay

## 2016-07-26 NOTE — Telephone Encounter (Signed)
  Follow up Call-  Call back number 07/25/2016 08/06/2015  Post procedure Call Back phone  # [phone]  Permission to leave phone message Yes Yes     Patient questions:  Do you have a fever, pain , or abdominal swelling? No. Pain Score  0 *  Have you tolerated food without any problems? Yes.    Have you been able to return to your normal activities? Yes.    Do you have any questions about your discharge instructions: Diet   No. Medications  No. Follow up visit  No.  Do you have questions or concerns about your Care? No.  Actions: * If pain score is 4 or above: No action needed, pain <4.

## 2016-07-27 ENCOUNTER — Encounter: Payer: Self-pay | Admitting: Internal Medicine

## 2019-07-02 ENCOUNTER — Encounter: Payer: Self-pay | Admitting: Internal Medicine

## 2019-08-18 ENCOUNTER — Encounter: Payer: PRIVATE HEALTH INSURANCE | Admitting: Internal Medicine

## 2019-08-21 ENCOUNTER — Encounter: Payer: PRIVATE HEALTH INSURANCE | Admitting: Internal Medicine

## 2019-12-19 ENCOUNTER — Encounter: Payer: Self-pay | Admitting: Internal Medicine
